# Patient Record
Sex: Male | Born: 1940 | Race: White | Hispanic: No | Smoking: Former smoker
Health system: Southern US, Community
[De-identification: ages and names within clinical notes are randomized; demographics above are authoritative.]

## PROBLEM LIST (undated history)

## (undated) DIAGNOSIS — K900 Celiac disease: Secondary | ICD-10-CM

---

## 2017-08-24 ENCOUNTER — Emergency Department: Payer: Medicare HMO

## 2017-08-24 ENCOUNTER — Emergency Department
Admission: EM | Admit: 2017-08-24 | Discharge: 2017-08-24 | Disposition: A | Discharge status: Home / Self Care | Payer: Medicare HMO | Attending: Emergency Medicine | Admitting: Emergency Medicine

## 2017-08-24 ENCOUNTER — Encounter: Payer: Self-pay | Admitting: Emergency Medicine

## 2017-08-24 DIAGNOSIS — Y999 Unspecified external cause status: Secondary | ICD-10-CM | POA: Diagnosis not present

## 2017-08-24 DIAGNOSIS — S42292A Other displaced fracture of upper end of left humerus, initial encounter for closed fracture: Secondary | ICD-10-CM | POA: Diagnosis not present

## 2017-08-24 DIAGNOSIS — W0110XA Fall on same level from slipping, tripping and stumbling with subsequent striking against unspecified object, initial encounter: Secondary | ICD-10-CM | POA: Diagnosis not present

## 2017-08-24 DIAGNOSIS — Z87891 Personal history of nicotine dependence: Secondary | ICD-10-CM | POA: Insufficient documentation

## 2017-08-24 DIAGNOSIS — S4992XA Unspecified injury of left shoulder and upper arm, initial encounter: Secondary | ICD-10-CM | POA: Diagnosis present

## 2017-08-24 DIAGNOSIS — Y929 Unspecified place or not applicable: Secondary | ICD-10-CM | POA: Insufficient documentation

## 2017-08-24 DIAGNOSIS — Y939 Activity, unspecified: Secondary | ICD-10-CM | POA: Diagnosis not present

## 2017-08-24 HISTORY — DX: Celiac disease: K90.0

## 2017-08-24 MED ORDER — OXYCODONE-ACETAMINOPHEN 5-325 MG PO TABS
1.0000 | ORAL_TABLET | Freq: Once | ORAL | Status: AC
  Administered 2017-08-24: 1 via ORAL

## 2017-08-24 MED ORDER — ONDANSETRON 4 MG PO TBDP
ORAL_TABLET | ORAL | Status: AC
  Filled 2017-08-24: qty 1

## 2017-08-24 MED ORDER — HYDROMORPHONE HCL 1 MG/ML IJ SOLN
INTRAMUSCULAR | Status: AC
  Administered 2017-08-24: 1 mg via INTRAMUSCULAR
  Filled 2017-08-24: qty 1

## 2017-08-24 MED ORDER — ONDANSETRON 4 MG PO TBDP
4.0000 mg | ORAL_TABLET | Freq: Once | ORAL | Status: AC
  Administered 2017-08-24: 4 mg via ORAL

## 2017-08-24 MED ORDER — HYDROMORPHONE HCL 1 MG/ML IJ SOLN
1.0000 mg | Freq: Once | INTRAMUSCULAR | Status: AC
  Administered 2017-08-24: 1 mg via INTRAMUSCULAR

## 2017-08-24 MED ORDER — HYDROMORPHONE HCL 1 MG/ML IJ SOLN
INTRAMUSCULAR | Status: AC
  Filled 2017-08-24: qty 1

## 2017-08-24 MED ORDER — OXYCODONE-ACETAMINOPHEN 5-325 MG PO TABS: 1.0000 | ORAL_TABLET | Freq: Four times a day (QID) | ORAL | 0 refills | Status: AC | PRN

## 2017-08-24 MED ORDER — OXYCODONE-ACETAMINOPHEN 5-325 MG PO TABS
ORAL_TABLET | ORAL | Status: AC
  Administered 2017-08-24: 1 via ORAL
  Filled 2017-08-24: qty 1

## 2017-08-24 NOTE — ED Triage Notes (Signed)
Mechanical fall 2 hours ago, pain L shoulder.

## 2017-08-24 NOTE — ED Notes (Signed)
Pt. States he fell landing on lt. Shoulder.  Pt. States pain to upper lt. Arm.  Swelling noted with no open lacerations.

## 2017-08-24 NOTE — Discharge Instructions (Signed)
Please follow-up with with peek [facility] on the number provided to arrange a follow-up appointment in the next 3-5 days. Please take your pain medication as needed, as written. Return to the emergency department for any significant increase in pain, or any other symptom personally concerning to yourself.  [place] office: [phone]

## 2017-08-24 NOTE — ED Notes (Signed)
Pt. Going home with friend 

## 2017-08-24 NOTE — ED Provider Notes (Signed)
[facility] Emergency Department Provider Note  Time seen: 5:15 PM  I have reviewed the triage vital signs and the nursing notes.   HISTORY  Chief Complaint Shoulder Pain    HPI Zachary Stevens is a 76 y.o. male with a past medical history of hypertension presents to the emergency department after a fall with left shoulder pain. According to the patient he was at a high school reunion when he tripped and fell landing on his left side. Patient states moderate to severe pain in his left shoulder. Denies hitting his head. Denies LOC. Denies any other injuries.  Past Medical History:  Diagnosis Date  . Celiac disease     There are no active problems to display for this patient.   History reviewed. No pertinent surgical history.  Prior to Admission medications   Not on File    Allergies no known allergies  No family history on file.  Social History Social History  Substance Use Topics  . Smoking status: Former Games developer  . Smokeless tobacco: Not on file  . Alcohol use Not on file    Review of Systems Constitutional: Negative for fever. Cardiovascular: Negative for chest pain. Respiratory: Negative for shortness of breath. Gastrointestinal: Negative for abdominal pain Musculoskeletal: Left shoulder pain Neurological: Negative for headache All other ROS negative  ____________________________________________   PHYSICAL EXAM:  VITAL SIGNS: ED Triage Vitals  Enc Vitals Group     BP 08/24/17 1532 (!) 172/78     Pulse Rate 08/24/17 1532 81     Resp 08/24/17 1532 20     Temp 08/24/17 1532 98.6 F (37 C)     Temp Source 08/24/17 1532 Oral     SpO2 08/24/17 1532 97 %     Weight 08/24/17 1533 210 lb (95.3 kg)     Height 08/24/17 1533  (1.702 m)     Head Circumference --      Peak Flow --      Pain Score 08/24/17 1532 10     Pain Loc --      Pain Edu? --      Excl. in GC? --     Constitutional: Alert and oriented. Well appearing and in  no distress. Eyes: Normal exam ENT   Head: Normocephalic and atraumatic.   Mouth/Throat: Mucous membranes are moist. Cardiovascular: Normal rate, regular rhythm. No murmur Respiratory: Normal respiratory effort without tachypnea nor retractions. Breath sounds are clear  Gastrointestinal: Soft and nontender. No distention.  Musculoskeletal: Left shoulder tenderness, significant pain with any attempted range of motion. Neurovascular intact with 2+ radial pulse, sensation intact. Neurologic:  Normal speech and language. No gross focal neurologic deficits Skin:  Skin is warm, dry and intact.  Psychiatric: Mood and affect are normal.   ____________________________________________     RADIOLOGY  IMPRESSION: Acute mildly displaced and comminuted oblique fracture of left proximal humerus extending from the humeral head to proximal humeral shaft. No joint dislocation.  ____________________________________________   INITIAL IMPRESSION / ASSESSMENT AND PLAN / ED COURSE  Pertinent labs & imaging results that were available during my care of the patient were reviewed by me and considered in my medical decision making (see chart for details).  Patient presents to the emergency department after a fall of left shoulder pain. Differential this time include fracture, dislocation, contusion. X-ray consistent with displaced and comminuted fracture from the left humeral head through the proximal left shaft.  Discussed the patient with Dr. Hyacinth Meeker of orthopedics. We will dose  pain medication and attempt to place the patient in a shoulder immobilizer. As long as patient's pain can be controlled anticipate likely discharge home with orthopedic follow-up.  Patient's pain is much better controlled. Patient is now in an immobilizer. Daughter is coming to pick up. We'll discharge with with peek follow-up.  ____________________________________________   FINAL CLINICAL IMPRESSION(S) / ED  DIAGNOSES  Shoulder fracture    Minna Antis, MD 08/24/17 [facility]

## 2017-08-24 NOTE — ED Notes (Signed)
Patient transported to X-ray 

## 2017-08-24 NOTE — ED Notes (Signed)
ED Provider at bedside. 

## 2018-05-20 IMAGING — CR DG SHOULDER 2+V*L*
1 series · 3 of 3 positions shown · non-contrast
Comparison: None.

CLINICAL DATA: 76 y/o  M; severe left shoulder pain.

EXAM:
LEFT SHOULDER - 2+ VIEW

[Series 1: dg shoulder left · 0.14mm/px · 3 of 3 slices shown]
[im 1/3]
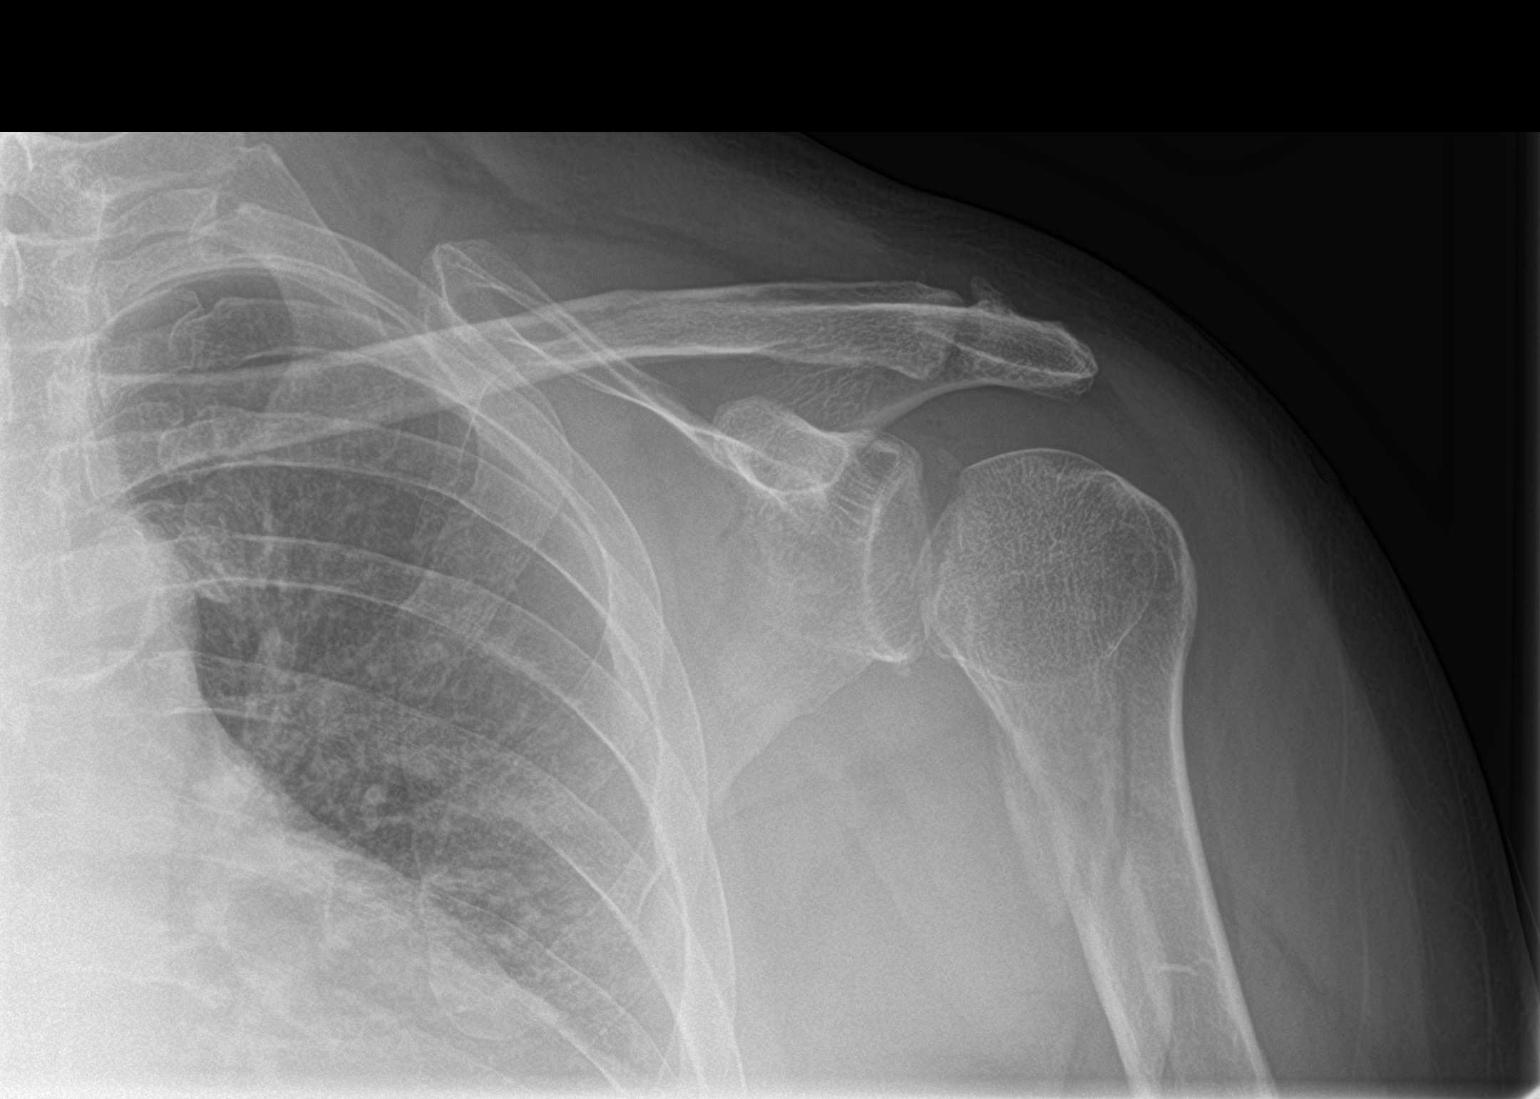
[im 2/3]
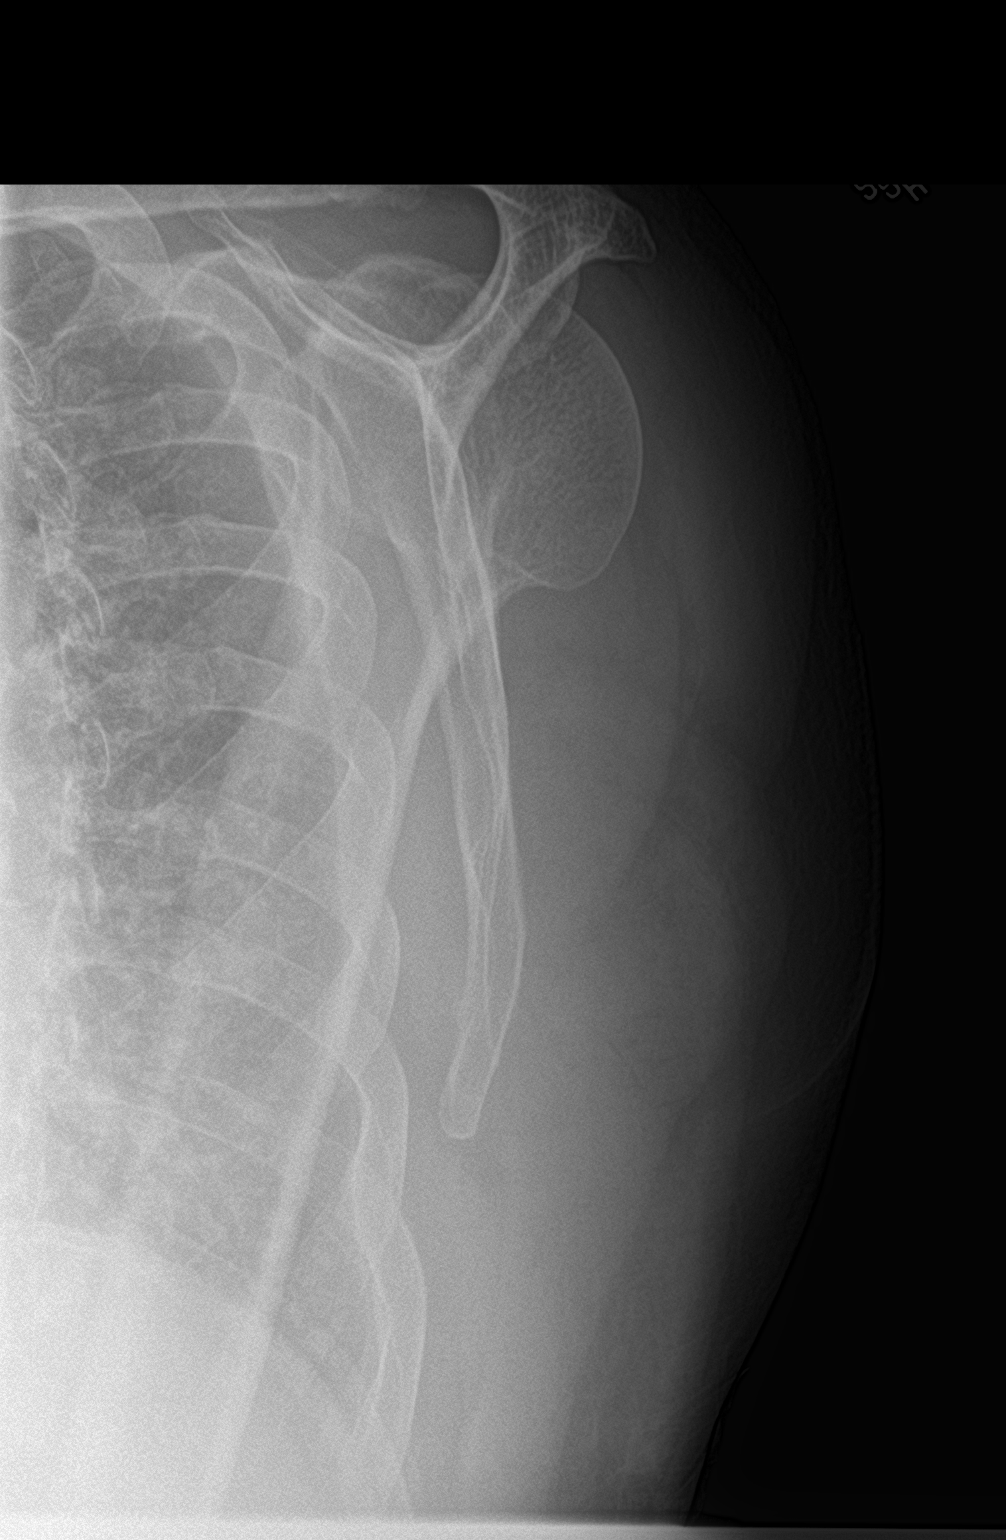
[im 3/3]
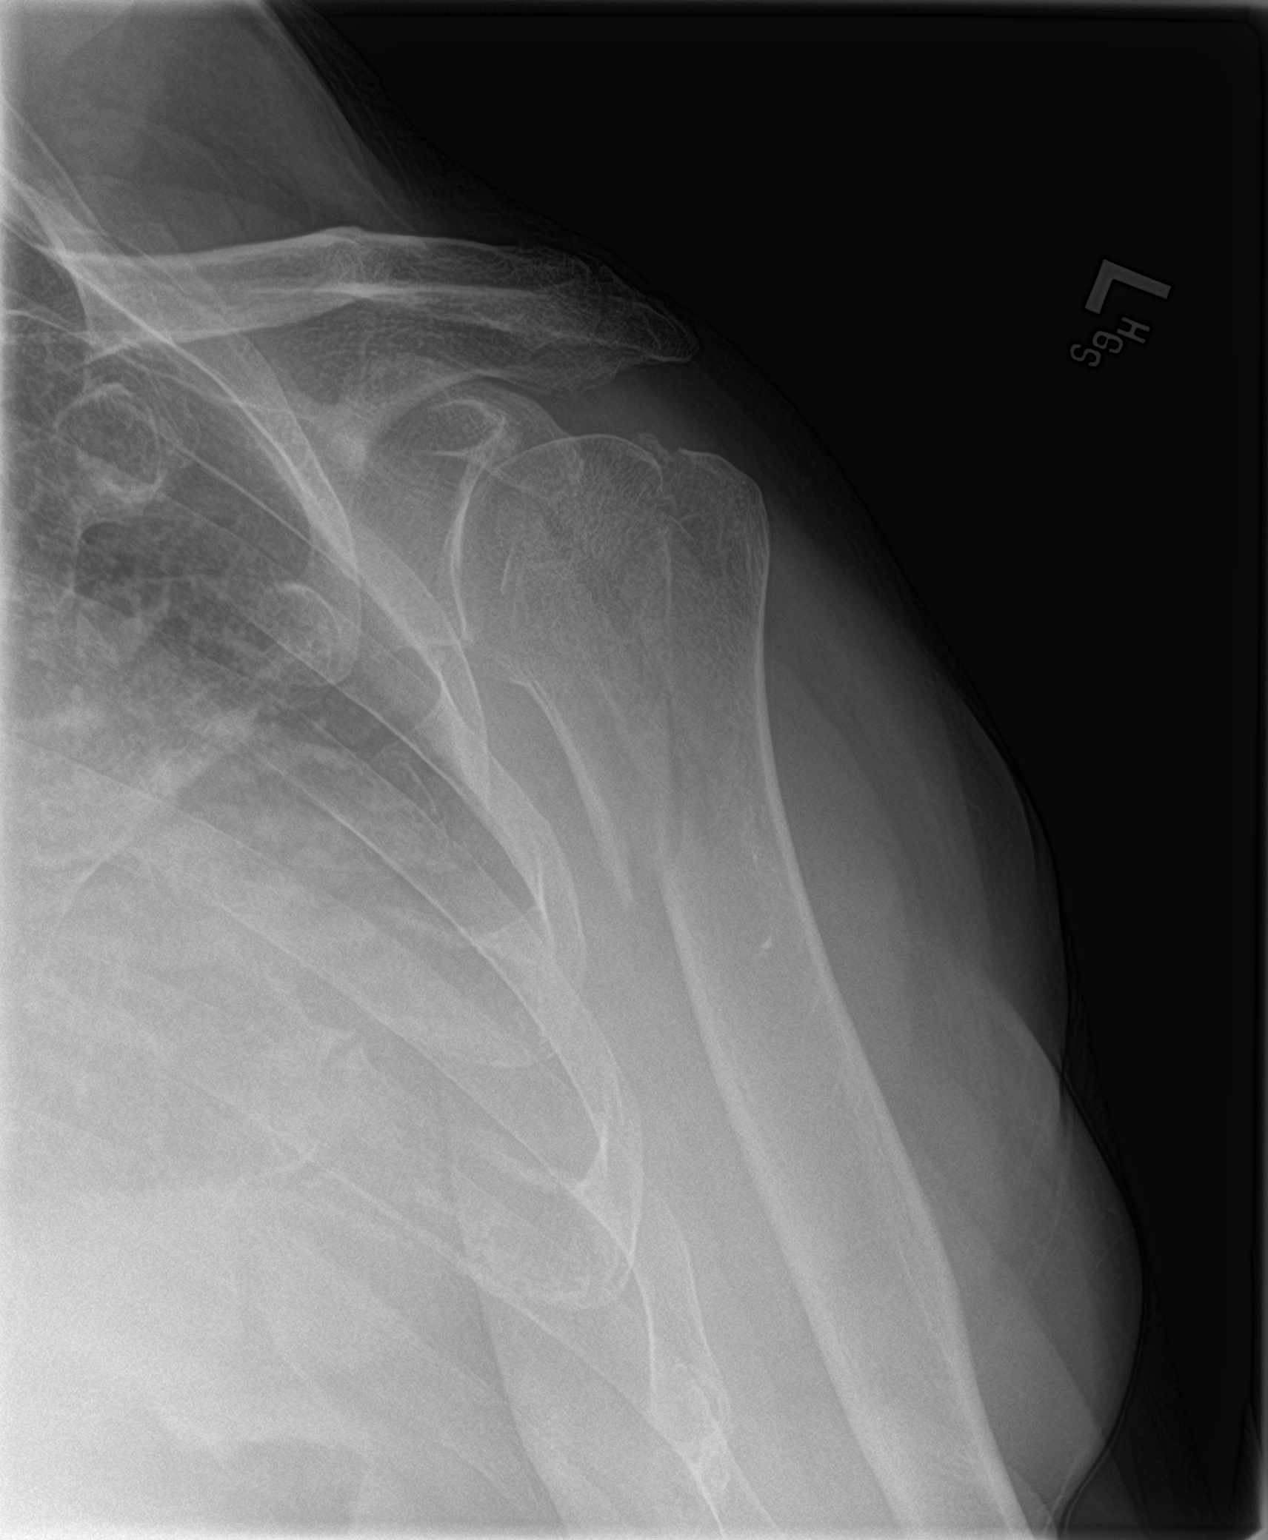

[3 of 3 positions shown; findings below may reference images not displayed]

FINDINGS: Acute mildly displaced oblique fracture of the left proximal humerus
extending between the tubercles into the medial proximal humeral
shaft with mild comminution. No joint dislocation.
IMPRESSION: Acute mildly displaced and comminuted oblique fracture of left
proximal humerus extending from the humeral head to proximal humeral
shaft. No joint dislocation.

By: Yolie Jin M.D.
